# Patient Record
Sex: Male | Born: 1960 | Race: White | Hispanic: No | Marital: Single | Smoking: Former smoker
Health system: Southern US, Community
[De-identification: ages and names within clinical notes are randomized; demographics above are authoritative.]

## PROBLEM LIST (undated history)

## (undated) DIAGNOSIS — S2220XA Unspecified fracture of sternum, initial encounter for closed fracture: Secondary | ICD-10-CM

## (undated) DIAGNOSIS — I10 Essential (primary) hypertension: Secondary | ICD-10-CM

---

## 2009-11-02 ENCOUNTER — Ambulatory Visit: Payer: Self-pay | Admitting: Family Medicine

## 2010-02-22 NOTE — Assessment & Plan Note (Signed)
Summary: FLU SHOT/JBB  Nurse Visit   Immunizations Administered:  Influenza Vaccine:    Vaccine Type: FLULAVAL    Site: right deltoid    Mfr: GlaxoSmithKline    Dose: 0.5 ml    Route: IM    Given by: Levonne Spiller EMT-P    Exp. Date: 06/23/2010    Lot #: [id number]    VIS given: 08/17/09 version given November 02, 2009.   Immunizations Administered:  Influenza Vaccine:    Vaccine Type: FLULAVAL    Site: right deltoid    Mfr: GlaxoSmithKline    Dose: 0.5 ml    Route: IM    Given by: Levonne Spiller EMT-P    Exp. Date: 06/23/2010    Lot #: [id number]    VIS given: 08/17/09 version given November 02, 2009.  Flu Vaccine Consent Questions:    Do you have a history of severe allergic reactions to this vaccine? no    Any prior history of allergic reactions to egg and/or gelatin? no    Do you have a sensitivity to the preservative Thimersol? no    Do you have a past history of Guillan-Barre Syndrome? no    Do you currently have an acute febrile illness? no    Have you ever had a severe reaction to latex? no    Vaccine information given and explained to patient? yes

## 2012-12-02 ENCOUNTER — Ambulatory Visit: Payer: Self-pay | Admitting: Unknown Physician Specialty

## 2012-12-03 LAB — PATHOLOGY REPORT

## 2013-11-06 ENCOUNTER — Ambulatory Visit: Payer: Self-pay | Admitting: General Surgery

## 2013-11-26 ENCOUNTER — Encounter: Payer: Self-pay | Admitting: *Deleted

## 2017-10-08 ENCOUNTER — Encounter: Payer: Self-pay | Admitting: Emergency Medicine

## 2017-10-08 ENCOUNTER — Other Ambulatory Visit: Payer: Self-pay

## 2017-10-08 ENCOUNTER — Emergency Department: Payer: BLUE CROSS/BLUE SHIELD

## 2017-10-08 ENCOUNTER — Emergency Department
Admission: EM | Admit: 2017-10-08 | Discharge: 2017-10-08 | Disposition: A | Discharge status: Home / Self Care | Payer: BLUE CROSS/BLUE SHIELD | Attending: Emergency Medicine | Admitting: Emergency Medicine

## 2017-10-08 DIAGNOSIS — R0981 Nasal congestion: Secondary | ICD-10-CM | POA: Insufficient documentation

## 2017-10-08 DIAGNOSIS — Z87891 Personal history of nicotine dependence: Secondary | ICD-10-CM | POA: Diagnosis not present

## 2017-10-08 DIAGNOSIS — Z79899 Other long term (current) drug therapy: Secondary | ICD-10-CM | POA: Diagnosis not present

## 2017-10-08 DIAGNOSIS — J40 Bronchitis, not specified as acute or chronic: Secondary | ICD-10-CM | POA: Insufficient documentation

## 2017-10-08 DIAGNOSIS — R05 Cough: Secondary | ICD-10-CM | POA: Diagnosis present

## 2017-10-08 DIAGNOSIS — I10 Essential (primary) hypertension: Secondary | ICD-10-CM | POA: Insufficient documentation

## 2017-10-08 HISTORY — DX: Essential (primary) hypertension: I10

## 2017-10-08 MED ORDER — LORATADINE 10 MG PO TABS
10.0000 mg | ORAL_TABLET | Freq: Once | ORAL | Status: AC
  Administered 2017-10-08: 10 mg via ORAL
  Filled 2017-10-08: qty 1

## 2017-10-08 MED ORDER — ALBUTEROL SULFATE HFA 108 (90 BASE) MCG/ACT IN AERS: 2.0000 | INHALATION_SPRAY | Freq: Four times a day (QID) | RESPIRATORY_TRACT | 0 refills | Status: DC | PRN

## 2017-10-08 MED ORDER — IPRATROPIUM-ALBUTEROL 0.5-2.5 (3) MG/3ML IN SOLN
3.0000 mL | Freq: Once | RESPIRATORY_TRACT | Status: AC
  Administered 2017-10-08: 3 mL via RESPIRATORY_TRACT
  Filled 2017-10-08: qty 3

## 2017-10-08 MED ORDER — PSEUDOEPHEDRINE HCL 30 MG PO TABS
30.0000 mg | ORAL_TABLET | Freq: Once | ORAL | Status: AC
  Administered 2017-10-08: 30 mg via ORAL
  Filled 2017-10-08: qty 1

## 2017-10-08 MED ORDER — OXYMETAZOLINE HCL 0.05 % NA SOLN
1.0000 | Freq: Once | NASAL | Status: AC
  Administered 2017-10-08: 1 via NASAL
  Filled 2017-10-08: qty 15

## 2017-10-08 MED ORDER — BENZONATATE 100 MG PO CAPS: 100.0000 mg | ORAL_CAPSULE | Freq: Four times a day (QID) | ORAL | 0 refills | Status: AC | PRN

## 2017-10-08 MED ORDER — IBUPROFEN 600 MG PO TABS
600.0000 mg | ORAL_TABLET | Freq: Once | ORAL | Status: AC
  Administered 2017-10-08: 600 mg via ORAL
  Filled 2017-10-08: qty 1

## 2017-10-08 NOTE — ED Provider Notes (Signed)
[facility] Emergency Department Provider Note  ____________________________________________   First MD Initiated Contact with Patient 10/08/17 0408     (approximate)  I have reviewed the triage vital signs and the nursing notes.   HISTORY  Chief Complaint Nasal Congestion   HPI Jaime Curry is a 57 y.o. male who self presents to the emergency department with 6 days of sore throat, dry cough, mild hemoptysis, and 2 days of left sided sinus congestion.  The sinus congestion kept him up tonight.  He has a primary care follow-up this morning but he could not wait because he could not get to sleep.  He is taken Tylenol with minimal relief along with Mucinex with no relief.  Pain is worse when lying flat improved with standing up.  He does not smoke cigarettes.  He initially had fevers and chills 5 or 6 days ago but they have subsequently resolved.  His sore throat is mostly better and his primary concern now is the dry cough along with the sinus congestion.  No purulent material coming from his nose.   Past Medical History:  Diagnosis Date  . Hypertension     There are no active problems to display for this patient.   History reviewed. No pertinent surgical history.  Prior to Admission medications   Medication Sig Start Date End Date Taking? Authorizing Provider  amLODipine (NORVASC) 10 MG tablet Take 10 mg by mouth daily.   Yes [provider]  metoprolol succinate (TOPROL-XL) 100 MG 24 hr tablet Take 100 mg by mouth daily. Take with or immediately following a meal.   Yes [provider]  ramipril (ALTACE) 10 MG capsule Take 10 mg by mouth daily.   Yes [provider]  albuterol (PROVENTIL HFA;VENTOLIN HFA) 108 (90 Base) MCG/ACT inhaler Inhale 2 puffs into the lungs every 6 (six) hours as needed for wheezing or shortness of breath. 10/08/17   Merrily Brittle, MD  benzonatate (TESSALON PERLES) 100 MG capsule Take 1 capsule (100 mg  total) by mouth every 6 (six) hours as needed for cough. 10/08/17 10/08/18  Merrily Brittle, MD    Allergies Patient has no known allergies.  History reviewed. No pertinent family history.  Social History Social History   Tobacco Use  . Smoking status: Former Games developer  . Smokeless tobacco: Never Used  Substance Use Topics  . Alcohol use: Yes  . Drug use: Never    Review of Systems Constitutional: Positive for fevers ENT: Positive for sore throat. Cardiovascular: Denies chest pain. Respiratory: Positive for cough Gastrointestinal: No abdominal pain.  No nausea, no vomiting.   Musculoskeletal: Negative for back pain. Neurological: Positive for headaches   ____________________________________________   PHYSICAL EXAM:  VITAL SIGNS: ED Triage Vitals  Enc Vitals Group     BP 10/08/17 0245 (!) 154/81     Pulse Rate 10/08/17 0245 83     Resp 10/08/17 0245 18     Temp 10/08/17 0245 (!) 97.4 F (36.3 C)     Temp Source 10/08/17 0245 Oral     SpO2 10/08/17 0245 98 %     Weight 10/08/17 0245 220 lb (99.8 kg)     Height 10/08/17 0245 6\' 2"  (1.88 m)     Head Circumference --      Peak Flow --      Pain Score 10/08/17 0249 10     Pain Loc --      Pain Edu? --      Excl. in  GC? --     Constitutional: Alert and oriented x4 appears uncomfortable holding the left side of his face Head: Atraumatic.  Normal tympanic membranes bilaterally.  He does have left maxillary sinus tenderness.  Normal nasopharynx bilaterally Nose: No congestion/rhinnorhea. Mouth/Throat: No trismus Neck: No stridor.   Cardiovascular: Regular rate and rhythm Respiratory: Normal respiratory effort.  No retractions.  Mild expiratory wheeze throughout lung sounds are equal bilaterally Neurologic:  Normal speech and language. No gross focal neurologic deficits are appreciated.  Skin:  Skin is warm, dry and intact. No rash noted.    ____________________________________________  LABS (all labs ordered are  listed, but only abnormal results are displayed)  Labs Reviewed - No data to display   __________________________________________  EKG   ____________________________________________  RADIOLOGY  Chest x-ray reviewed by me with no acute disease ____________________________________________   DIFFERENTIAL includes but not limited to  Sinusitis, sinus congestion, upper respiratory tract infection, pneumonia   PROCEDURES  Procedure(s) performed: no  Procedures  Critical Care performed: no  ____________________________________________   INITIAL IMPRESSION / ASSESSMENT AND PLAN / ED COURSE  Pertinent labs & imaging results that were available during my care of the patient were reviewed by me and considered in my medical decision making (see chart for details).   As part of my medical decision making, I reviewed the following data within the electronic MEDICAL RECORD NUMBER History obtained from family if available, nursing notes, old chart and ekg, as well as notes from prior ED visits.  The patient arrives uncomfortable appearing with about a week of URI symptoms that have now progressed to sinus congestion and a dry cough.  On auscultation he has some wheezing consistent with bronchitis and given a breathing treatment with improvement in his symptoms.  Chest x-ray with no infiltrate.  We discussed the indications for antibiotics for sinus infection he does not meet any of them.  Symptoms have not been 10 days, no purulent discharge, and no fever greater than 101.4.  Given Afrin, Sudafed, ibuprofen, and Claritin here with some improvement in his symptoms.  We discussed sinus rinses and have encouraged him to keep his follow-up with primary care.  He is discharged home in improved condition verbalized understanding and agreement with the plan.      ____________________________________________   FINAL CLINICAL IMPRESSION(S) / ED DIAGNOSES  Final diagnoses:  Bronchitis  Sinus  congestion      NEW MEDICATIONS STARTED DURING THIS VISIT:  Discharge Medication List as of 10/08/2017  4:43 AM    START taking these medications   Details  albuterol (PROVENTIL HFA;VENTOLIN HFA) 108 (90 Base) MCG/ACT inhaler Inhale 2 puffs into the lungs every 6 (six) hours as needed for wheezing or shortness of breath., Starting Mon 10/08/2017, Print    benzonatate (TESSALON PERLES) 100 MG capsule Take 1 capsule (100 mg total) by mouth every 6 (six) hours as needed for cough., Starting Mon 10/08/2017, Until Tue 10/08/2018, Print         Note:  This document was prepared using Dragon voice recognition software and may include unintentional dictation errors.      Merrily Brittleifenbark, Bleu Moisan, MD 10/08/17 [phone]
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
## 2017-10-08 NOTE — ED Triage Notes (Addendum)
Pt says he started feeling bad the beginning of this past week-productive cough, sore throat; pt says pain has moved up to his sinuses and he's having trouble sleeping; left cheek with some swelling; pt says the pain is up just below his eye; pt talking in complete coherent sentences

## 2018-08-31 ENCOUNTER — Emergency Department: Payer: BC Managed Care – PPO

## 2018-08-31 ENCOUNTER — Other Ambulatory Visit: Payer: Self-pay

## 2018-08-31 ENCOUNTER — Emergency Department
Admission: EM | Admit: 2018-08-31 | Discharge: 2018-08-31 | Disposition: A | Discharge status: Home / Self Care | Payer: BC Managed Care – PPO | Attending: Emergency Medicine | Admitting: Emergency Medicine

## 2018-08-31 ENCOUNTER — Encounter: Payer: Self-pay | Admitting: Emergency Medicine

## 2018-08-31 DIAGNOSIS — Y939 Activity, unspecified: Secondary | ICD-10-CM | POA: Insufficient documentation

## 2018-08-31 DIAGNOSIS — I10 Essential (primary) hypertension: Secondary | ICD-10-CM | POA: Diagnosis not present

## 2018-08-31 DIAGNOSIS — S29001A Unspecified injury of muscle and tendon of front wall of thorax, initial encounter: Secondary | ICD-10-CM | POA: Diagnosis present

## 2018-08-31 DIAGNOSIS — Y92410 Unspecified street and highway as the place of occurrence of the external cause: Secondary | ICD-10-CM | POA: Insufficient documentation

## 2018-08-31 DIAGNOSIS — Z79899 Other long term (current) drug therapy: Secondary | ICD-10-CM | POA: Diagnosis not present

## 2018-08-31 DIAGNOSIS — Y999 Unspecified external cause status: Secondary | ICD-10-CM | POA: Insufficient documentation

## 2018-08-31 DIAGNOSIS — S2224XA Fracture of xiphoid process, initial encounter for closed fracture: Secondary | ICD-10-CM | POA: Diagnosis not present

## 2018-08-31 DIAGNOSIS — Z87891 Personal history of nicotine dependence: Secondary | ICD-10-CM | POA: Diagnosis not present

## 2018-08-31 LAB — CBC WITH DIFFERENTIAL/PLATELET
Abs Immature Granulocytes: 0.06 10*3/uL (ref 0.00–0.07)
Basophils Absolute: 0 10*3/uL (ref 0.0–0.1)
Basophils Relative: 0 %
Eosinophils Absolute: 0 10*3/uL (ref 0.0–0.5)
Eosinophils Relative: 0 %
HCT: 49.5 % (ref 39.0–52.0)
Hemoglobin: 17.5 g/dL — ABNORMAL HIGH (ref 13.0–17.0)
Immature Granulocytes: 1 %
Lymphocytes Relative: 10 %
Lymphs Abs: 0.9 10*3/uL (ref 0.7–4.0)
MCH: 31.3 pg (ref 26.0–34.0)
MCHC: 35.4 g/dL (ref 30.0–36.0)
MCV: 88.4 fL (ref 80.0–100.0)
Monocytes Absolute: 0.6 10*3/uL (ref 0.1–1.0)
Monocytes Relative: 7 %
Neutro Abs: 7.3 10*3/uL (ref 1.7–7.7)
Neutrophils Relative %: 82 %
Platelets: 179 10*3/uL (ref 150–400)
RBC: 5.6 MIL/uL (ref 4.22–5.81)
RDW: 11.7 % (ref 11.5–15.5)
WBC: 9 10*3/uL (ref 4.0–10.5)
nRBC: 0 % (ref 0.0–0.2)

## 2018-08-31 LAB — COMPREHENSIVE METABOLIC PANEL
ALT: 17 U/L (ref 0–44)
AST: 30 U/L (ref 15–41)
Albumin: 4.2 g/dL (ref 3.5–5.0)
Alkaline Phosphatase: 44 U/L (ref 38–126)
Anion gap: 8 (ref 5–15)
BUN: 18 mg/dL (ref 6–20)
CO2: 23 mmol/L (ref 22–32)
Calcium: 8.7 mg/dL — ABNORMAL LOW (ref 8.9–10.3)
Chloride: 104 mmol/L (ref 98–111)
Creatinine, Ser: 1.11 mg/dL (ref 0.61–1.24)
GFR calc Af Amer: >60 mL/min (ref >60)
GFR calc non Af Amer: >60 mL/min (ref >60)
Glucose, Bld: 185 mg/dL — ABNORMAL HIGH (ref 70–99)
Potassium: 3.4 mmol/L — ABNORMAL LOW (ref 3.5–5.1)
Sodium: 135 mmol/L (ref 135–145)
Total Bilirubin: 1 mg/dL (ref 0.3–1.2)
Total Protein: 6.6 g/dL (ref 6.5–8.1)

## 2018-08-31 LAB — TROPONIN I (HIGH SENSITIVITY): Troponin I (High Sensitivity): 3 ng/L (ref <18)

## 2018-08-31 LAB — LIPASE, BLOOD: Lipase: 38 U/L (ref 11–51)

## 2018-08-31 MED ORDER — NAPROXEN SODIUM 275 MG PO TABS: 275.0000 mg | ORAL_TABLET | Freq: Two times a day (BID) | ORAL | 0 refills | Status: AC

## 2018-08-31 MED ORDER — OXYCODONE-ACETAMINOPHEN 5-325 MG PO TABS: 1.0000 | ORAL_TABLET | Freq: Four times a day (QID) | ORAL | 0 refills | Status: AC | PRN

## 2018-08-31 MED ORDER — HYDROCODONE-ACETAMINOPHEN 5-325 MG PO TABS
2.0000 | ORAL_TABLET | Freq: Once | ORAL | Status: AC
  Administered 2018-08-31: 14:00:00 2 via ORAL
  Filled 2018-08-31: qty 2
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
## 2018-08-31 NOTE — Discharge Instructions (Signed)
For your sternal fracture -  Take the pain medication as prescribed. Stick to the naproxen and percocet. Do not take additional ibuprofen.  I expect your chest ot hurt for at least the next 2 weeks. Do not lift anything >15 lb for 2 weeks or until pain free.  If you develop worsening abdominal pain, nausea, vomiting, diarrhea, or other areas of significant pain, return to the ER immediately.
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
## 2018-12-16 ENCOUNTER — Other Ambulatory Visit: Payer: Self-pay | Admitting: Internal Medicine

## 2018-12-16 DIAGNOSIS — R9389 Abnormal findings on diagnostic imaging of other specified body structures: Secondary | ICD-10-CM

## 2018-12-18 ENCOUNTER — Telehealth: Payer: Self-pay | Admitting: *Deleted

## 2019-01-01 ENCOUNTER — Ambulatory Visit
Admission: RE | Admit: 2019-01-01 | Discharge: 2019-01-01 | Disposition: A | Discharge status: Home / Self Care | Payer: BC Managed Care – PPO | Source: Ambulatory Visit | Attending: Internal Medicine | Admitting: Internal Medicine

## 2019-01-01 ENCOUNTER — Other Ambulatory Visit: Payer: Self-pay

## 2019-01-01 ENCOUNTER — Ambulatory Visit: Payer: BC Managed Care – PPO

## 2019-01-01 DIAGNOSIS — R9389 Abnormal findings on diagnostic imaging of other specified body structures: Secondary | ICD-10-CM | POA: Insufficient documentation

## 2019-01-01 LAB — POCT I-STAT CREATININE: Creatinine, Ser: 1.2 mg/dL (ref 0.61–1.24)

## 2019-01-01 MED ORDER — IOHEXOL 300 MG/ML  SOLN
75.0000 mL | Freq: Once | INTRAMUSCULAR | Status: AC | PRN
  Administered 2019-01-01: 08:00:00 75 mL via INTRAVENOUS

## 2019-08-05 ENCOUNTER — Other Ambulatory Visit: Payer: Self-pay | Admitting: Pulmonary Disease

## 2019-08-05 DIAGNOSIS — R911 Solitary pulmonary nodule: Secondary | ICD-10-CM

## 2019-08-15 ENCOUNTER — Ambulatory Visit: Payer: No Typology Code available for payment source

## 2019-11-24 DIAGNOSIS — R9389 Abnormal findings on diagnostic imaging of other specified body structures: Secondary | ICD-10-CM

## 2019-11-24 DIAGNOSIS — I2541 Coronary artery aneurysm: Secondary | ICD-10-CM

## 2019-11-24 HISTORY — DX: Coronary artery aneurysm: I25.41

## 2019-11-24 HISTORY — DX: Abnormal findings on diagnostic imaging of other specified body structures: R93.89

## 2019-11-28 ENCOUNTER — Other Ambulatory Visit: Payer: Self-pay | Admitting: Pulmonary Disease

## 2019-11-28 DIAGNOSIS — R911 Solitary pulmonary nodule: Secondary | ICD-10-CM

## 2019-12-10 ENCOUNTER — Other Ambulatory Visit
Admission: RE | Admit: 2019-12-10 | Discharge: 2019-12-10 | Disposition: A | Discharge status: Home / Self Care | Payer: BC Managed Care – PPO | Source: Ambulatory Visit | Attending: Orthopedic Surgery | Admitting: Orthopedic Surgery

## 2019-12-10 ENCOUNTER — Other Ambulatory Visit: Payer: Self-pay | Admitting: Orthopedic Surgery

## 2019-12-10 ENCOUNTER — Encounter: Payer: Self-pay | Admitting: Orthopedic Surgery

## 2019-12-10 ENCOUNTER — Other Ambulatory Visit: Payer: Self-pay

## 2019-12-10 DIAGNOSIS — Z20822 Contact with and (suspected) exposure to covid-19: Secondary | ICD-10-CM | POA: Insufficient documentation

## 2019-12-10 DIAGNOSIS — Z01812 Encounter for preprocedural laboratory examination: Secondary | ICD-10-CM | POA: Insufficient documentation

## 2019-12-10 MED ORDER — CHLORHEXIDINE GLUCONATE 0.12 % MT SOLN: 15.0000 mL | Freq: Once | OROMUCOSAL | Status: AC

## 2019-12-10 MED ORDER — LACTATED RINGERS IV SOLN: INTRAVENOUS | Status: DC

## 2019-12-10 MED ORDER — ORAL CARE MOUTH RINSE: 15.0000 mL | Freq: Once | OROMUCOSAL | Status: AC

## 2019-12-10 MED ORDER — FAMOTIDINE 20 MG PO TABS: 20.0000 mg | ORAL_TABLET | Freq: Once | ORAL | Status: AC

## 2019-12-10 MED ORDER — CEFAZOLIN SODIUM-DEXTROSE 2-4 GM/100ML-% IV SOLN
2.0000 g | INTRAVENOUS | Status: AC
  Administered 2019-12-11: 2 g via INTRAVENOUS

## 2019-12-11 ENCOUNTER — Other Ambulatory Visit: Payer: Self-pay

## 2019-12-11 ENCOUNTER — Encounter
Admission: RE | Disposition: A | Discharge status: Home / Self Care | Payer: Self-pay | Source: Home / Self Care | Attending: Orthopedic Surgery

## 2019-12-11 ENCOUNTER — Ambulatory Visit: Payer: BC Managed Care – PPO | Admitting: Anesthesiology

## 2019-12-11 ENCOUNTER — Ambulatory Visit
Admission: RE | Admit: 2019-12-11 | Discharge: 2019-12-11 | Disposition: A | Discharge status: Home / Self Care | Payer: BC Managed Care – PPO | Attending: Orthopedic Surgery | Admitting: Orthopedic Surgery

## 2019-12-11 ENCOUNTER — Ambulatory Visit: Admit: 2019-12-11 | Payer: No Typology Code available for payment source | Admitting: Surgery

## 2019-12-11 ENCOUNTER — Encounter: Payer: Self-pay | Admitting: Orthopedic Surgery

## 2019-12-11 DIAGNOSIS — Z79899 Other long term (current) drug therapy: Secondary | ICD-10-CM | POA: Insufficient documentation

## 2019-12-11 DIAGNOSIS — W109XXA Fall (on) (from) unspecified stairs and steps, initial encounter: Secondary | ICD-10-CM | POA: Diagnosis not present

## 2019-12-11 DIAGNOSIS — Z791 Long term (current) use of non-steroidal anti-inflammatories (NSAID): Secondary | ICD-10-CM | POA: Insufficient documentation

## 2019-12-11 DIAGNOSIS — Z87891 Personal history of nicotine dependence: Secondary | ICD-10-CM | POA: Diagnosis not present

## 2019-12-11 DIAGNOSIS — S76111A Strain of right quadriceps muscle, fascia and tendon, initial encounter: Secondary | ICD-10-CM | POA: Diagnosis present

## 2019-12-11 HISTORY — PX: QUADRICEPS TENDON REPAIR: SHX756

## 2019-12-11 HISTORY — DX: Unspecified fracture of sternum, initial encounter for closed fracture: S22.20XA

## 2019-12-11 LAB — COMPREHENSIVE METABOLIC PANEL
ALT: 14 U/L (ref 0–44)
AST: 33 U/L (ref 15–41)
Albumin: 3.9 g/dL (ref 3.5–5.0)
Alkaline Phosphatase: 52 U/L (ref 38–126)
Anion gap: 10 (ref 5–15)
BUN: 17 mg/dL (ref 8–23)
CO2: 24 mmol/L (ref 22–32)
Calcium: 9.2 mg/dL (ref 8.9–10.3)
Chloride: 100 mmol/L (ref 98–111)
Creatinine, Ser: 1.1 mg/dL (ref 0.61–1.24)
GFR, Estimated: >60 mL/min (ref >60)
Glucose, Bld: 110 mg/dL — ABNORMAL HIGH (ref 70–99)
Potassium: 3.9 mmol/L (ref 3.5–5.1)
Sodium: 134 mmol/L — ABNORMAL LOW (ref 135–145)
Total Bilirubin: 1.3 mg/dL — ABNORMAL HIGH (ref 0.3–1.2)
Total Protein: 6.5 g/dL (ref 6.5–8.1)

## 2019-12-11 LAB — SARS CORONAVIRUS 2 (TAT 6-24 HRS): SARS Coronavirus 2: NEGATIVE

## 2019-12-11 SURGERY — REPAIR, TENDON, QUADRICEPS
Anesthesia: Choice | Laterality: Right

## 2019-12-11 SURGERY — REPAIR, TENDON, QUADRICEPS
Anesthesia: General | Site: Knee | Laterality: Right

## 2019-12-11 MED ORDER — CEFAZOLIN SODIUM-DEXTROSE 2-4 GM/100ML-% IV SOLN
INTRAVENOUS | Status: AC
  Filled 2019-12-11: qty 100

## 2019-12-11 MED ORDER — ROCURONIUM BROMIDE 100 MG/10ML IV SOLN
INTRAVENOUS | Status: DC | PRN
  Administered 2019-12-11: 20 mg via INTRAVENOUS
  Administered 2019-12-11: 50 mg via INTRAVENOUS

## 2019-12-11 MED ORDER — PROPOFOL 10 MG/ML IV BOLUS
INTRAVENOUS | Status: DC | PRN
  Administered 2019-12-11: 200 mg via INTRAVENOUS

## 2019-12-11 MED ORDER — CHLORHEXIDINE GLUCONATE 0.12 % MT SOLN
OROMUCOSAL | Status: AC
  Administered 2019-12-11: 15 mL via OROMUCOSAL
  Filled 2019-12-11: qty 15

## 2019-12-11 MED ORDER — OXYCODONE HCL 5 MG PO TABS
5.0000 mg | ORAL_TABLET | ORAL | 0 refills | Status: AC | PRN
Start: 2019-12-11 — End: ?

## 2019-12-11 MED ORDER — SODIUM CHLORIDE (PF) 0.9 % IJ SOLN
INTRAMUSCULAR | Status: AC
  Filled 2019-12-11: qty 50

## 2019-12-11 MED ORDER — KETAMINE HCL 50 MG/ML IJ SOLN
INTRAMUSCULAR | Status: DC | PRN
  Administered 2019-12-11: 50 mg via INTRAVENOUS

## 2019-12-11 MED ORDER — SODIUM CHLORIDE 0.9 % IV SOLN
INTRAVENOUS | Status: DC | PRN
  Administered 2019-12-11: 40 ug/min via INTRAVENOUS

## 2019-12-11 MED ORDER — OXYCODONE HCL 5 MG PO TABS: 5.0000 mg | ORAL_TABLET | ORAL | Status: DC | PRN

## 2019-12-11 MED ORDER — FENTANYL CITRATE (PF) 100 MCG/2ML IJ SOLN: 25.0000 ug | INTRAMUSCULAR | Status: DC | PRN

## 2019-12-11 MED ORDER — VASOPRESSIN 20 UNIT/ML IV SOLN
INTRAVENOUS | Status: DC | PRN
  Administered 2019-12-11: 2 [IU] via INTRAVENOUS

## 2019-12-11 MED ORDER — VASOPRESSIN 20 UNIT/ML IV SOLN
INTRAVENOUS | Status: AC
  Filled 2019-12-11: qty 1

## 2019-12-11 MED ORDER — 0.9 % SODIUM CHLORIDE (POUR BTL) OPTIME
TOPICAL | Status: DC | PRN
  Administered 2019-12-11: 1000 mL

## 2019-12-11 MED ORDER — POTASSIUM CHLORIDE IN NACL 20-0.9 MEQ/L-% IV SOLN
INTRAVENOUS | Status: DC
  Filled 2019-12-11 (×3): qty 1000

## 2019-12-11 MED ORDER — MIDAZOLAM HCL 2 MG/2ML IJ SOLN
INTRAMUSCULAR | Status: DC | PRN
  Administered 2019-12-11: 2 mg via INTRAVENOUS

## 2019-12-11 MED ORDER — DEXAMETHASONE SODIUM PHOSPHATE 10 MG/ML IJ SOLN
INTRAMUSCULAR | Status: DC | PRN
  Administered 2019-12-11: 10 mg via INTRAVENOUS

## 2019-12-11 MED ORDER — ONDANSETRON HCL 4 MG PO TABS: 4.0000 mg | ORAL_TABLET | Freq: Four times a day (QID) | ORAL | Status: DC | PRN

## 2019-12-11 MED ORDER — LIDOCAINE HCL (CARDIAC) PF 100 MG/5ML IV SOSY
PREFILLED_SYRINGE | INTRAVENOUS | Status: DC | PRN
  Administered 2019-12-11: 100 mg via INTRAVENOUS

## 2019-12-11 MED ORDER — ONDANSETRON HCL 4 MG/2ML IJ SOLN: 4.0000 mg | Freq: Four times a day (QID) | INTRAMUSCULAR | Status: DC | PRN

## 2019-12-11 MED ORDER — PROPOFOL 10 MG/ML IV BOLUS
INTRAVENOUS | Status: AC
  Filled 2019-12-11: qty 20

## 2019-12-11 MED ORDER — BUPIVACAINE LIPOSOME 1.3 % IJ SUSP
INTRAMUSCULAR | Status: AC
  Filled 2019-12-11: qty 20

## 2019-12-11 MED ORDER — ROCURONIUM BROMIDE 10 MG/ML (PF) SYRINGE
PREFILLED_SYRINGE | INTRAVENOUS | Status: AC
  Filled 2019-12-11: qty 10

## 2019-12-11 MED ORDER — BUPIVACAINE LIPOSOME 1.3 % IJ SUSP
INTRAMUSCULAR | Status: DC | PRN
  Administered 2019-12-11: 20 mL

## 2019-12-11 MED ORDER — PROMETHAZINE HCL 25 MG/ML IJ SOLN: 6.2500 mg | INTRAMUSCULAR | Status: DC | PRN

## 2019-12-11 MED ORDER — DEXAMETHASONE SODIUM PHOSPHATE 10 MG/ML IJ SOLN
INTRAMUSCULAR | Status: AC
  Filled 2019-12-11: qty 1

## 2019-12-11 MED ORDER — FENTANYL CITRATE (PF) 250 MCG/5ML IJ SOLN
INTRAMUSCULAR | Status: DC | PRN
  Administered 2019-12-11 (×2): 50 ug via INTRAVENOUS

## 2019-12-11 MED ORDER — FAMOTIDINE 20 MG PO TABS
ORAL_TABLET | ORAL | Status: AC
  Administered 2019-12-11: 20 mg via ORAL
  Filled 2019-12-11: qty 1

## 2019-12-11 MED ORDER — KETAMINE HCL 50 MG/ML IJ SOLN
INTRAMUSCULAR | Status: AC
  Filled 2019-12-11: qty 10

## 2019-12-11 MED ORDER — ACETAMINOPHEN 10 MG/ML IV SOLN
INTRAVENOUS | Status: DC | PRN
  Administered 2019-12-11: 1000 mg via INTRAVENOUS

## 2019-12-11 MED ORDER — ACETAMINOPHEN 10 MG/ML IV SOLN
INTRAVENOUS | Status: AC
  Filled 2019-12-11: qty 100

## 2019-12-11 MED ORDER — FENTANYL CITRATE (PF) 100 MCG/2ML IJ SOLN
INTRAMUSCULAR | Status: AC
  Filled 2019-12-11: qty 2

## 2019-12-11 MED ORDER — BUPIVACAINE HCL (PF) 0.5 % IJ SOLN
INTRAMUSCULAR | Status: AC
  Filled 2019-12-11: qty 30

## 2019-12-11 MED ORDER — ONDANSETRON HCL 4 MG/2ML IJ SOLN
INTRAMUSCULAR | Status: DC | PRN
  Administered 2019-12-11: 4 mg via INTRAVENOUS

## 2019-12-11 MED ORDER — SUGAMMADEX SODIUM 200 MG/2ML IV SOLN
INTRAVENOUS | Status: DC | PRN
  Administered 2019-12-11: 200 mg via INTRAVENOUS

## 2019-12-11 MED ORDER — MIDAZOLAM HCL 2 MG/2ML IJ SOLN
INTRAMUSCULAR | Status: AC
  Filled 2019-12-11: qty 2

## 2019-12-11 MED ORDER — METOCLOPRAMIDE HCL 10 MG PO TABS: 5.0000 mg | ORAL_TABLET | Freq: Three times a day (TID) | ORAL | Status: DC | PRN

## 2019-12-11 MED ORDER — LIDOCAINE HCL (PF) 2 % IJ SOLN
INTRAMUSCULAR | Status: AC
  Filled 2019-12-11: qty 5

## 2019-12-11 MED ORDER — ONDANSETRON HCL 4 MG/2ML IJ SOLN
INTRAMUSCULAR | Status: AC
  Filled 2019-12-11: qty 2

## 2019-12-11 MED ORDER — PHENYLEPHRINE HCL (PRESSORS) 10 MG/ML IV SOLN
INTRAVENOUS | Status: DC | PRN
  Administered 2019-12-11 (×5): 100 ug via INTRAVENOUS

## 2019-12-11 MED ORDER — METOCLOPRAMIDE HCL 5 MG/ML IJ SOLN: 5.0000 mg | Freq: Three times a day (TID) | INTRAMUSCULAR | Status: DC | PRN

## 2019-12-11 SURGICAL SUPPLY — 38 items
ANCH SUT 2 5 PRELD ROTR CUF (Anchor) ×3 IMPLANT
ANCHOR SUT 5.0 TWINFIX 2 ULBRD (Anchor) ×9 IMPLANT
APL PRP STRL LF DISP 70% ISPRP (MISCELLANEOUS) ×1
BNDG ELASTIC 6X5.8 VLCR STR LF (GAUZE/BANDAGES/DRESSINGS) ×3 IMPLANT
CANISTER SUCT 1200ML W/VALVE (MISCELLANEOUS) ×3 IMPLANT
CHLORAPREP W/TINT 26 (MISCELLANEOUS) ×3 IMPLANT
COVER WAND RF STERILE (DRAPES) ×3 IMPLANT
CUFF TOURN SGL QUICK 24 (TOURNIQUET CUFF)
CUFF TOURN SGL QUICK 30 (TOURNIQUET CUFF) ×3
CUFF TRNQT CYL 24X4X16.5-23 (TOURNIQUET CUFF) IMPLANT
CUFF TRNQT CYL 30X4X21-28X (TOURNIQUET CUFF) ×1 IMPLANT
DRSG OPSITE POSTOP 4X6 (GAUZE/BANDAGES/DRESSINGS) ×3 IMPLANT
ELECT REM PT RETURN 9FT ADLT (ELECTROSURGICAL) ×3
ELECTRODE REM PT RTRN 9FT ADLT (ELECTROSURGICAL) ×1 IMPLANT
GAUZE SPONGE 4X4 12PLY STRL (GAUZE/BANDAGES/DRESSINGS) ×3 IMPLANT
GAUZE XEROFORM 1X8 LF (GAUZE/BANDAGES/DRESSINGS) ×3 IMPLANT
GLOVE SURG SYN 9.0  PF PI (GLOVE) ×2
GLOVE SURG SYN 9.0 PF PI (GLOVE) ×1 IMPLANT
GOWN SRG 2XL LVL 4 RGLN SLV (GOWNS) ×1 IMPLANT
GOWN STRL NON-REIN 2XL LVL4 (GOWNS) ×3
GOWN STRL REUS W/ TWL LRG LVL3 (GOWN DISPOSABLE) ×1 IMPLANT
GOWN STRL REUS W/TWL LRG LVL3 (GOWN DISPOSABLE) ×3
KIT TURNOVER KIT A (KITS) ×3 IMPLANT
MANIFOLD NEPTUNE II (INSTRUMENTS) ×3 IMPLANT
NDL MAYO CATGUT SZ1 (NEEDLE) ×3
NEEDLE MAYO CATGUT SZ1 (NEEDLE) ×1 IMPLANT
NS IRRIG 1000ML POUR BTL (IV SOLUTION) ×3 IMPLANT
PACK TOTAL KNEE (MISCELLANEOUS) ×3 IMPLANT
PAD ABD DERMACEA PRESS 5X9 (GAUZE/BANDAGES/DRESSINGS) ×6 IMPLANT
PASSER SUT SWANSON 36MM LOOP (INSTRUMENTS) IMPLANT
SCALPEL PROTECTED #10 DISP (BLADE) ×6 IMPLANT
SPONGE LAP 18X18 RF (DISPOSABLE) ×3 IMPLANT
STAPLER SKIN PROX 35W (STAPLE) ×3 IMPLANT
SUT FIBERWIRE #2 38 BLUE 1/2 (SUTURE) ×6
SUT VIC AB 0 CT1 36 (SUTURE) ×3 IMPLANT
SUT VIC AB 2-0 CT1 27 (SUTURE) ×3
SUT VIC AB 2-0 CT1 TAPERPNT 27 (SUTURE) ×1 IMPLANT
SUTURE FIBERWR #2 38 BLUE 1/2 (SUTURE) ×2 IMPLANT

## 2019-12-11 NOTE — H&P (Signed)
History of Present Illness: Jaime Curry is a 59 y.o. male who presents today for evaluation of right knee distal quads tendon rupture. Patient suffered a fall yesterday in [place], was going down a step, fell directly onto the knee and felt a tear above the patella. He has been unable to actively extend the knee. Had x-rays taken in urgent care in [place] that were negative for any acute fracture, no patella fracture but patella alignment consistent with possible quad tendon rupture. Patient has a lot of pain and swelling. He is wearing a knee immobilizer and using crutches. He is taken oxycodone every 6 hours as needed. Patient drives a concrete truck.  Past Medical History: . Degenerative joint disease (DJD) of lumbar spine  . Hypertension  . Hypertensive cardiovascular disease   Past Surgical History: . COLONOSCOPY  . Dislocated collarbone Right 2002  . HERNIA REPAIR   Past Family History: . Sarcoma (Cancer of soft tissue) Mother  . Lymphoma Father  . Stroke Father   Medications: . amLODIPine (NORVASC) 10 MG tablet TAKE 1 TABLET BY MOUTH ONCE DAILY 90 tablet 1  . hydroCHLOROthiazide (HYDRODIURIL) 25 MG tablet TAKE 1 TABLET BY MOUTH ONCE DAILY 90 tablet 1  . metoprolol succinate (TOPROL-XL) 100 MG XL tablet TAKE 1 TABLET BY MOUTH ONCE DAILY 90 tablet 1  . naproxen sodium (ALEVE) 275 MG tablet TAKE 1 TABLET BY MOUTH TWICE DAILY WITH MEL FOR 7 DAYS  . oxyCODONE-acetaminophen (PERCOCET) 5-325 mg tablet TAKE 1 2 TABLETS BY MOUTH EVERY 6 HOURS AS NEEDED FOR MODERATE TO SEVERE PAIN  . ramipriL (ALTACE) 10 MG capsule TAKE 1 CAPSULE BY MOUTH TWICE DAILY 180 capsule 1  . sildenafil (REVATIO) 20 mg tablet TAKE 2 TABLETS BY MOUTH AS NEEDED 60 tablet 5   Allergies: No Known Allergies   Review of Systems:  A comprehensive 14 point ROS was performed, reviewed by me today, and the pertinent orthopaedic findings are documented in the HPI.  Physical Exam: BP 166/78  Ht 188 cm (6\' 2" )  Wt  99.8 kg (220 lb)  BMI 28.25 kg/m  General:  Well developed, well nourished, no apparent distress, normal affect, antalgic gait with crutches.  HEENT: Head normocephalic, atraumatic, PERRL.   Abdomen: Soft, non tender, non distended, Bowel sounds present.  Heart: Examination of the heart reveals regular, rate, and rhythm. There is no murmur noted on ascultation. There is a normal apical pulse.  Lungs: Lungs are clear to auscultation. There is no wheeze, rhonchi, or crackles. There is normal expansion of bilateral chest walls.   Examination of the right lower extremity shows patient has good range of motion of the hip with internal and external rotation. A lot of soft tissue swelling just superior to the patella with palpable defect. It is unable to maintain any active extension of the knee. Neurovascular intact right lower extremity. No swelling or edema throughout the right lower leg. Ankle plantarflexion dorsiflexion is intact.  X-rays of the right knee reviewed by me today from outside facility yesterday in . No evidence of fracture. Patella is in normal position but the superior pole is tilted forward. There is no dislocation. No fracture.  Impression: Acute right quadriceps tendon rupture.  Plan:  59 year old male with right knee trauma. Based on mechanism of injury, x-ray findings, history and exam there is concern for quad tendon rupture. Dr. 77 was able to perform ultrasound evaluation today and confirm quad tendon tear. Risks, benefits, complications of a right knee quad tendon repair have  been discussed with the patient. Patient has agreed and consented to procedure tomorrow 12/10/2019.   H&P reviewed and patient re-examined. No changes.

## 2019-12-11 NOTE — Progress Notes (Signed)
Dr Karlton Lemon notified of EKG and that is in room.

## 2019-12-11 NOTE — Op Note (Addendum)
12/11/2019  12:15 PM  Patient:   Jaime Curry  Pre-Op Diagnosis:   Acute quadriceps tendon rupture, right knee.  Post-Op Diagnosis:   Same.  Procedure:   Primary repair of right quadriceps tendon rupture.  Surgeon:   Maryagnes Amos, MD  Assistant:   Orvan July, PA-S  Anesthesia:   GET  Findings:   As above.  Complications:   None  Fluids:   900 cc crystalloid  EBL:   25 cc  TT:   80 minutes at 300 mmHg  Drains:   None  Closure:   Staples  Implants:   [employer]hew 5 mm titanium corkscrew anchors 3  Brief Clinical Note:   The patient is a 59 year old male who sustained the above-noted injury 2 days ago when he struck his knee on a cedar post while descending a steep flight of stairs. He presented to our clinic yesterday where an ultrasound confirmed the above-noted injury. The patient presents at this time for definitive management of this injury.  Procedure:   The patient was brought into the operating room and lain in the supine position. After adequate general endotracheal intubation and anesthesia were obtained, the patient's right lower extremity was prepped with ChloraPrep solution before being draped sterilely. Preoperative antibiotics were administered. A timeout was performed to verify the appropriate surgical site before the limb was exsanguinated with an Esmarch and the thigh tourniquet inflated to 300 mmHg.   An approximately 4-5 inch incision was made over the anterior aspect of the knee centered at the superior pole of the patella. The incision was carried down through the subcutaneous tissues to expose the superficial retinaculum. This was split the length of the incision to reveal the ruptured quadriceps tendon. The torn end of the tendon was debrided sharply back to good tissues while the attachment site at the superior pole of the patella was debrided down to bleeding bone with a rongeur. Three Smith [employer] Nephew 5 mm titanium corkscrew anchors were inserted into  the superior pole of the patella after predrilling with a 3.2 mm drill bit. Each set of sutures was woven through the tendon before being tied securely to effect the repair. The repair was deemed stable with knee flexion to 80. Several of the sutures were brought back through the distal quadriceps tendon using free needles and tied again to reinforce this repair. In addition, the torn portions of the retinaculum medially and laterally were re-approximated using #2 FiberWire interrupted sutures. Again knee flexion was assessed. The repair was shown to be stable with knee flexion to 85, supporting the weight of the lower leg independently in the process.  The wound was copiously irrigated with sterile saline solution using bulb irrigation before the superficial retinacular layer was re-approximated using #0 Vicryl interrupted sutures. The subcutaneous tissues were closed in two layers using 2-0 Vicryl interrupted sutures before the skin was closed using staples. A total of 20 cc of Exparel was injected in and around the incision site as well as intra-articularly to help with postoperative analgesia before a sterile occlusive dressing was applied to the knee. A Polar Care device was applied to the before the leg was placed into a postoperative hinged knee brace with the hinges set at 0-60, but locked in extension. The patient was then awakened, extubated, and returned to the recovery room in satisfactory condition after tolerating the procedure well.

## 2019-12-11 NOTE — Transfer of Care (Signed)
Immediate Anesthesia Transfer of Care Note  Patient: Jaime Curry  Procedure(s) Performed: REPAIR QUADRICEP TENDON (Right Knee)  Patient Location: PACU  Anesthesia Type:General  Level of Consciousness: drowsy  Airway & Oxygen Therapy: Patient Spontanous Breathing and Patient connected to face mask oxygen  Post-op Assessment: Report given to RN and Post -op Vital signs reviewed and stable  Post vital signs: Reviewed and stable  Last Vitals:  Vitals Value Taken Time  BP 104/48 12/11/19 1204  Temp 36.1 C 12/11/19 1204  Pulse 70 12/11/19 1205  Resp 12 12/11/19 1205  SpO2 99 % 12/11/19 1205  Vitals shown include unvalidated device data.  Last Pain:  Vitals:   12/11/19 0732  TempSrc: Tympanic         Complications: No complications documented.

## 2019-12-11 NOTE — Discharge Instructions (Addendum)
Orthopedic discharge instructions: Keep dressing dry and intact.  May sponge-bathe after dressing changed on post-op day #4 (Monday) so long as op-site remains intact.  Apply ice frequently to knee or use Polar Care. Take ibuprofen 800 mg TID with meals for 7-10 days, then as necessary. Take oxycodone as prescribed when needed.  May supplement with ES Tylenol if necessary. May weight-bear as tolerated so long as brace on and locked in extension - use crutches or walker as needed. Follow-up in 10-14 days or as scheduled.  AMBULATORY SURGERY  DISCHARGE INSTRUCTIONS   Interscalene Nerve Block with Exparel  1.  For your surgery you have received an Interscalene Nerve Block with Exparel. 2. Nerve Blocks affect many types of nerves, including nerves that control movement, pain and normal sensation.  You may experience feelings such as numbness, tingling, heaviness, weakness or the inability to move your arm or the feeling or sensation that your arm has "fallen asleep". 3. A nerve block with Exparel can last up to 5 days.  Usually the weakness wears off first.  The tingling and heaviness usually wear off next.  Finally you may start to notice pain.  Keep in mind that this may occur in any order.  Once a nerve block starts to wear off it is usually completely gone within 60 minutes. 4. ISNB may cause mild shortness of breath, a hoarse voice, blurry vision, unequal pupils, or drooping of the face on the same side as the nerve block.  These symptoms will usually resolve with the numbness.  Very rarely the procedure itself can cause mild seizures. 5. If needed, your surgeon will give you a prescription for pain medication.  It will take about 60 minutes for the oral pain medication to become fully effective.  So, it is recommended that you start taking this medication before the nerve block first begins to wear off, or when you first begin to feel discomfort. 6. Take your pain medication only as prescribed.   Pain medication can cause sedation and decrease your breathing if you take more than you need for the level of pain that you have. 7. Nausea is a common side effect of many pain medications.  You may want to eat something before taking your pain medicine to prevent nausea. 8. After an Interscalene nerve block, you cannot feel pain, pressure or extremes in temperature in the effected arm.  Because your arm is numb it is at an increased risk for injury.  To decrease the possibility of injury, please practice the following:  a. While you are awake change the position of your arm frequently to prevent too much pressure on any one area for prolonged periods of time. b.  If you have a cast or tight dressing, check the color or your fingers every couple of hours.  Call your surgeon with the appearance of any discoloration (white or blue). c. If you are given a sling to wear before you go home, please wear it  at all times until the block has completely worn off.  Do not get up at night without your sling. d. Please contact [facility] or your surgeon if you do not begin to regain sensation after 7 days from the surgery.  Anesthesia may be contacted by calling the Same Day Surgery Department, Mon. through Fri., 6 am to 4 pm at [phone].   e. If you experience any other problems or concerns, please contact your surgeon's office. If you experience severe or prolonged shortness of  breath go to the nearest emergency department.   1) The drugs that you were given will stay in your system until tomorrow so for the next 24 hours you should not:  A) Drive an automobile B) Make any legal decisions C) Drink any alcoholic beverage   2) You may resume regular meals tomorrow.  Today it is better to start with liquids and gradually work up to solid foods.  You may eat anything you prefer, but it is better to start with liquids, then soup and crackers, and gradually work up to solid foods.   3) Please  notify your doctor immediately if you have any unusual bleeding, trouble breathing, redness and pain at the surgery site, drainage, fever, or pain not relieved by medication.    4) Additional Instructions:        Please contact your physician with any problems or Same Day Surgery at [phone], Monday through Friday 6 am to 4 pm, or [place] [facility] number at [phone].

## 2019-12-11 NOTE — Anesthesia Postprocedure Evaluation (Signed)
Anesthesia Post Note  Patient: Jaime Curry  Procedure(s) Performed: REPAIR QUADRICEP TENDON (Right Knee)  Patient location during evaluation: PACU Anesthesia Type: General Level of consciousness: awake and alert Pain management: pain level controlled Vital Signs Assessment: post-procedure vital signs reviewed and stable Respiratory status: spontaneous breathing, nonlabored ventilation, respiratory function stable and patient connected to nasal cannula oxygen Cardiovascular status: blood pressure returned to baseline and stable Postop Assessment: no apparent nausea or vomiting Anesthetic complications: no   No complications documented.   Last Vitals:  Vitals:   12/11/19 1234 12/11/19 1254  BP: 110/66 133/81  Pulse: 78 75  Resp: 18 18  Temp: 36.8 C 36.4 C  SpO2: 94% 97%    Last Pain:  Vitals:   12/11/19 1254  TempSrc: Tympanic  PainSc: 3                  Lenard Simmer

## 2019-12-11 NOTE — Anesthesia Procedure Notes (Signed)
Procedure Name: Intubation Date/Time: 12/11/2019 10:19 AM Performed by: Dava Najjar, CRNA Pre-anesthesia Checklist: Patient identified, Emergency Drugs available, Suction available and Patient being monitored Patient Re-evaluated:Patient Re-evaluated prior to induction Oxygen Delivery Method: Circle system utilized Preoxygenation: Pre-oxygenation with 100% oxygen Induction Type: IV induction and Cricoid Pressure applied Ventilation: Mask ventilation without difficulty and Oral airway inserted - appropriate to patient size Laryngoscope Size: Hyacinth Meeker and 2 Grade View: Grade I Tube type: Oral Tube size: 7.5 mm Number of attempts: 1 Airway Equipment and Method: Stylet and Oral airway Placement Confirmation: ETT inserted through vocal cords under direct vision,  positive ETCO2 and breath sounds checked- equal and bilateral Secured at: 23 cm Tube secured with: Tape Dental Injury: Teeth and Oropharynx as per pre-operative assessment
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
## 2019-12-12 ENCOUNTER — Encounter: Payer: Self-pay | Admitting: Surgery

## 2020-08-17 ENCOUNTER — Other Ambulatory Visit: Payer: Self-pay | Admitting: Pulmonary Disease

## 2020-08-17 DIAGNOSIS — R911 Solitary pulmonary nodule: Secondary | ICD-10-CM
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
## 2023-03-06 ENCOUNTER — Ambulatory Visit: Payer: BC Managed Care – PPO

## 2023-03-06 DIAGNOSIS — K5730 Diverticulosis of large intestine without perforation or abscess without bleeding: Secondary | ICD-10-CM | POA: Diagnosis not present

## 2023-03-06 DIAGNOSIS — K641 Second degree hemorrhoids: Secondary | ICD-10-CM | POA: Diagnosis not present

## 2023-03-06 DIAGNOSIS — Z1211 Encounter for screening for malignant neoplasm of colon: Secondary | ICD-10-CM | POA: Diagnosis present
